# Patient Record
Sex: Male | Born: 1975 | Race: Black or African American | Hispanic: No | Marital: Married | Smoking: Former smoker
Health system: Southern US, Community
[De-identification: ages and names within clinical notes are randomized; demographics above are authoritative.]

---

## 2004-09-28 ENCOUNTER — Emergency Department: Payer: Self-pay | Admitting: General Practice

## 2004-10-19 ENCOUNTER — Inpatient Hospital Stay: Payer: Self-pay | Admitting: Internal Medicine

## 2004-10-19 ENCOUNTER — Other Ambulatory Visit: Payer: Self-pay

## 2004-10-26 ENCOUNTER — Ambulatory Visit: Payer: Self-pay

## 2005-01-26 ENCOUNTER — Other Ambulatory Visit: Payer: Self-pay

## 2005-01-26 ENCOUNTER — Ambulatory Visit: Payer: Self-pay | Admitting: Family Medicine

## 2006-07-26 ENCOUNTER — Emergency Department: Payer: Self-pay | Admitting: Emergency Medicine

## 2007-02-15 ENCOUNTER — Emergency Department (HOSPITAL_COMMUNITY): Admission: EM | Admit: 2007-02-15 | Discharge: 2007-02-15 | Payer: Self-pay | Admitting: Emergency Medicine

## 2008-05-21 ENCOUNTER — Other Ambulatory Visit: Payer: Self-pay

## 2008-05-21 ENCOUNTER — Emergency Department: Payer: Self-pay | Admitting: Emergency Medicine

## 2008-05-28 IMAGING — CR DG CHEST 2V
2 series · 2 of 2 positions shown · non-contrast
Comparison: None.

CLINICAL DATA: Chest pain

[w chest pa]
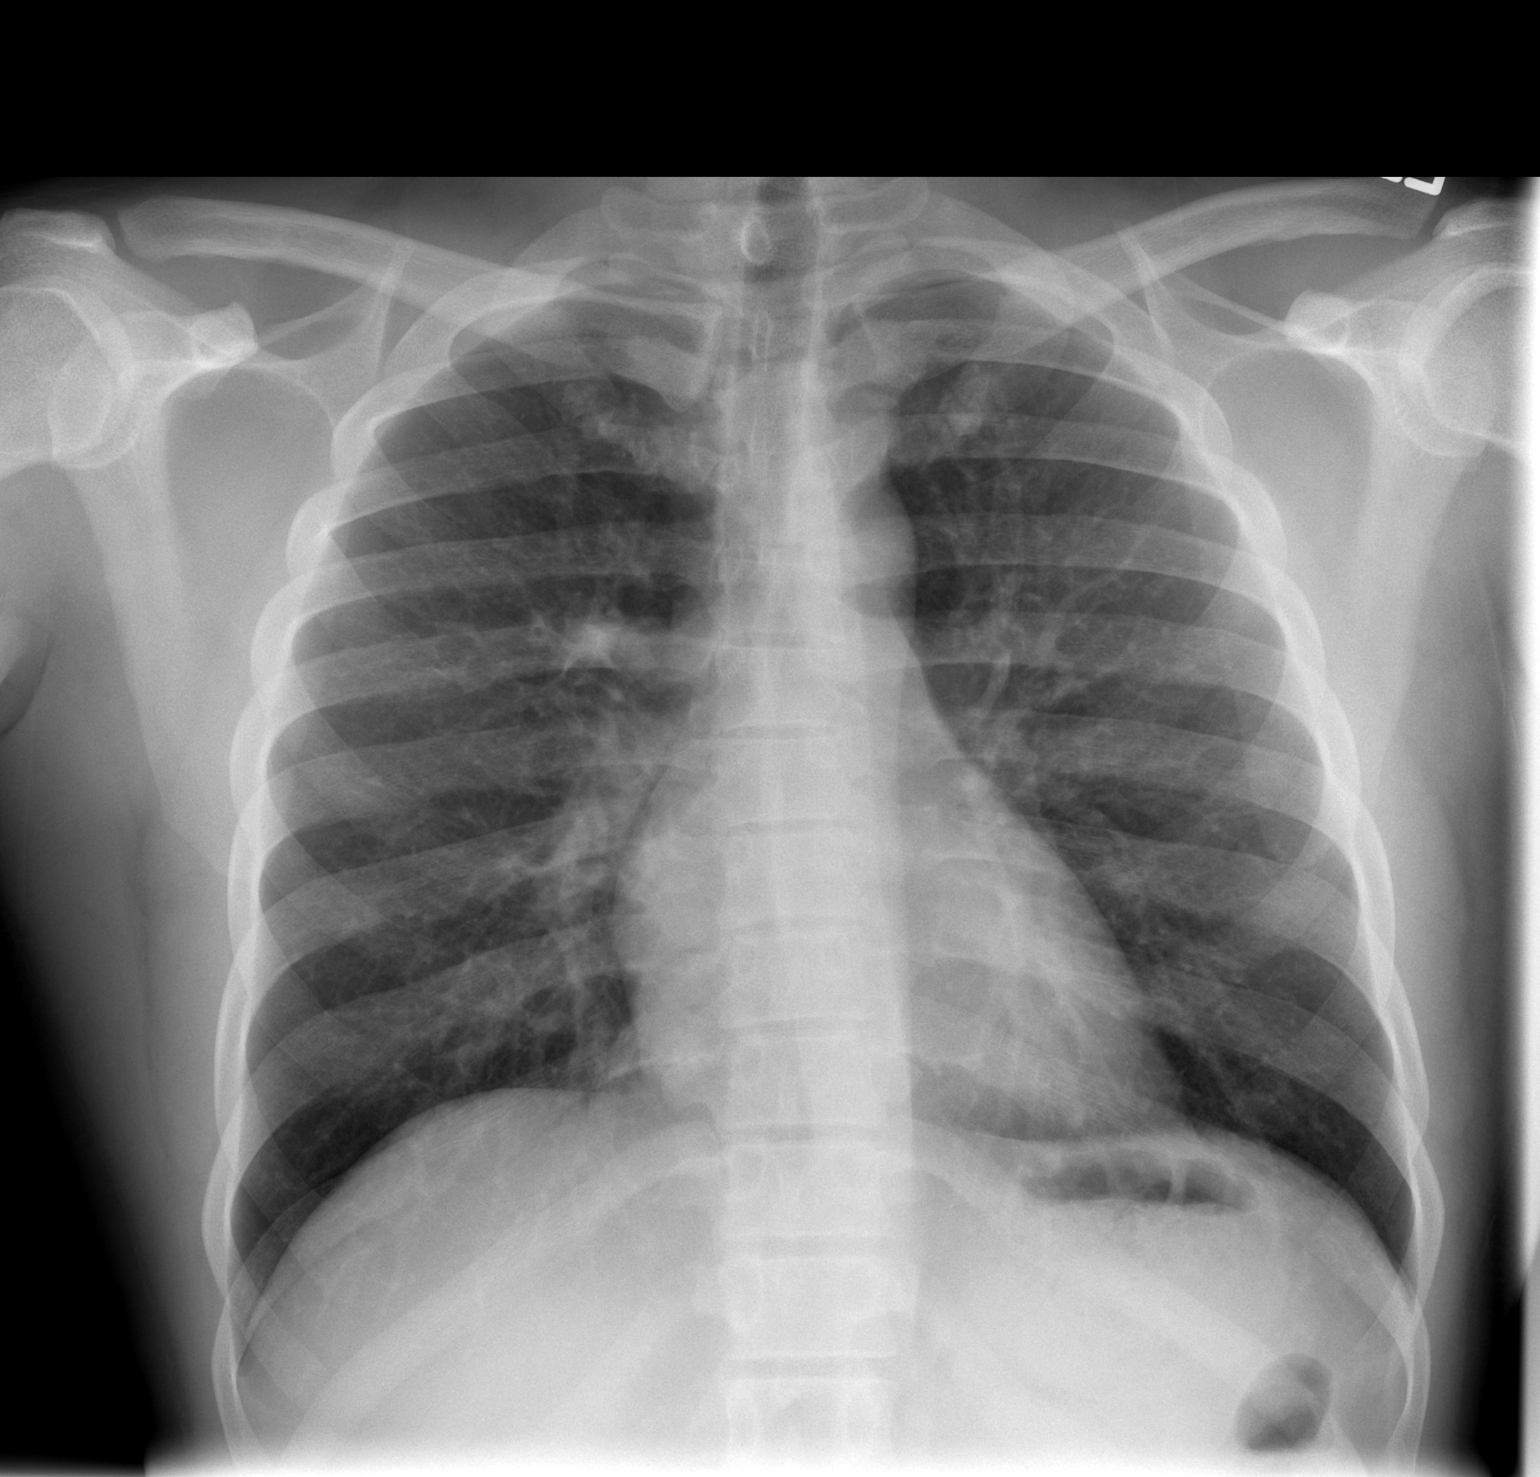

[w chest lat]
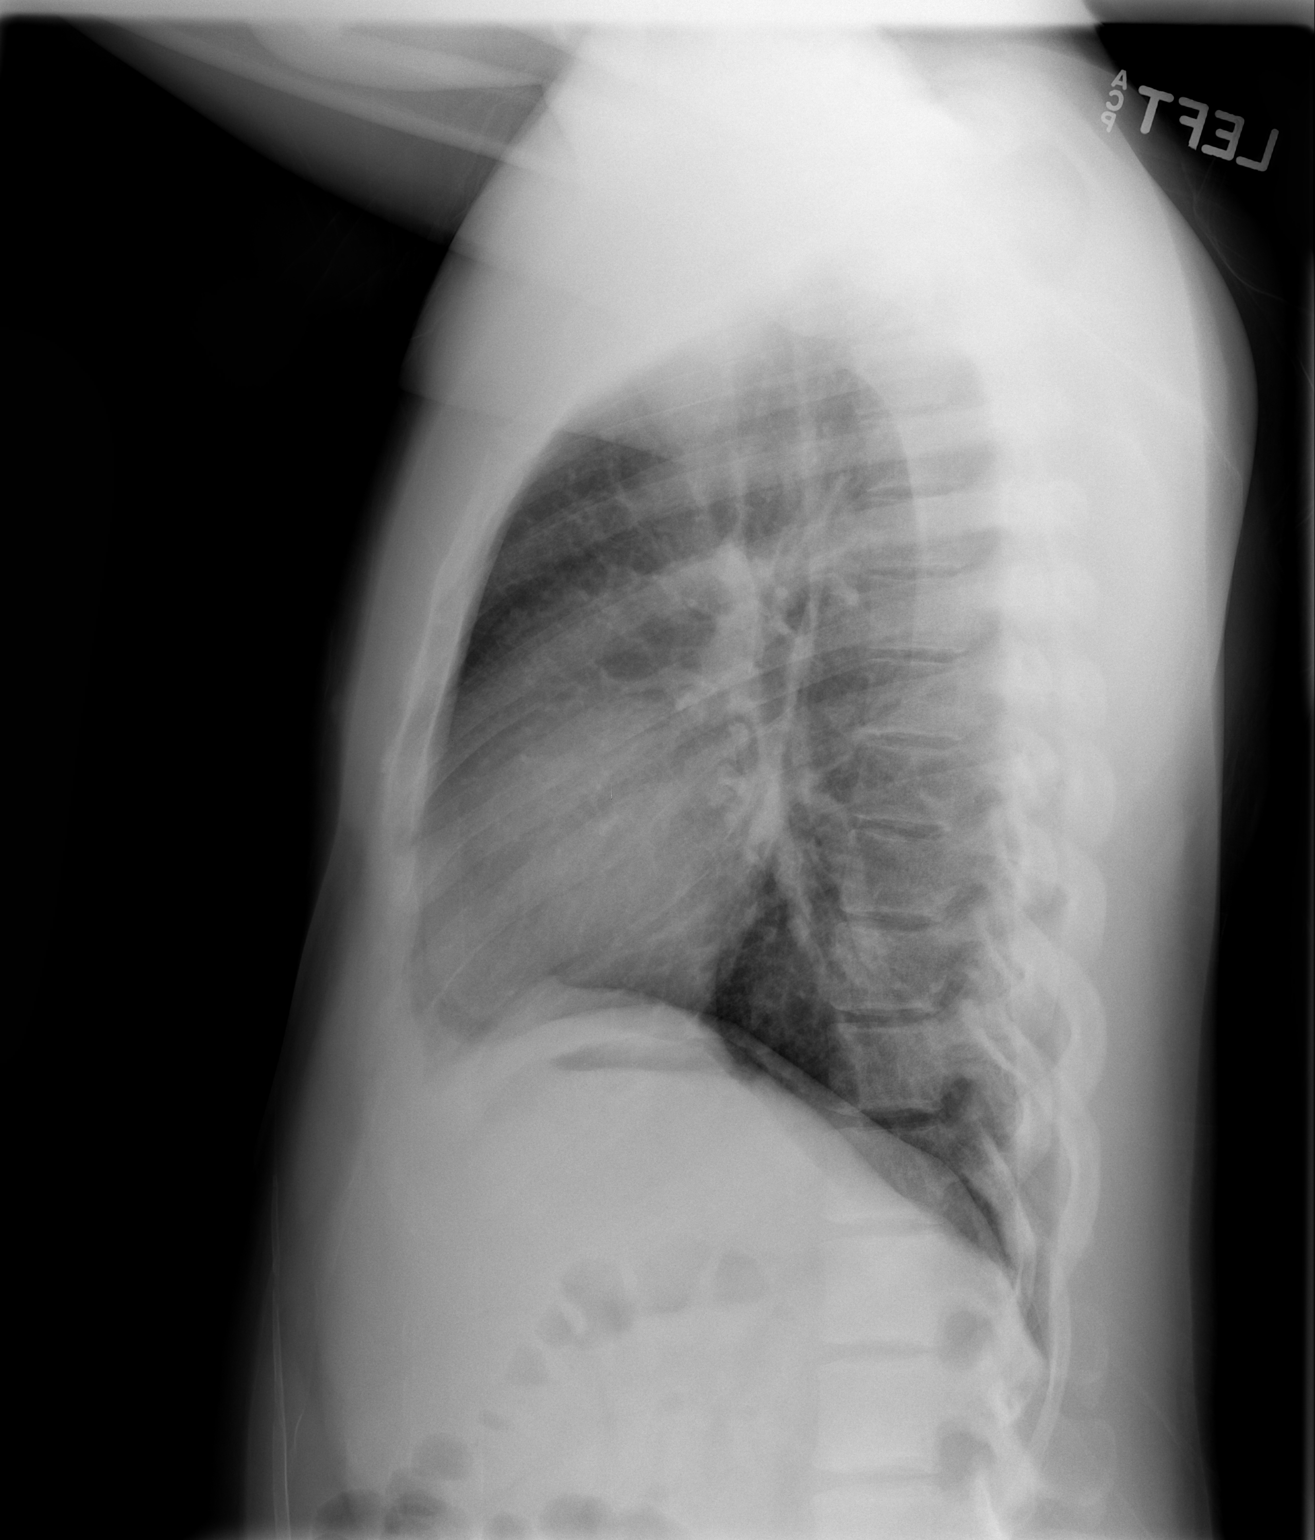

[2 of 2 positions shown; findings below may reference images not displayed]

CHEST - 2 VIEW:

The lungs are clear without focal infiltrate, edema or pleural effusion.
Interstitial markings are diffusely coarsened. Imaged bony structures of the
thorax are intact.
IMPRESSION: No acute cardiopulmonary process

## 2008-07-06 ENCOUNTER — Emergency Department: Payer: Self-pay | Admitting: Emergency Medicine

## 2009-08-15 ENCOUNTER — Inpatient Hospital Stay: Payer: Self-pay | Admitting: Internal Medicine

## 2009-09-01 IMAGING — CR DG CHEST 1V PORT
1 series · 1 of 1 positions shown · non-contrast
Comparison: none

REASON FOR EXAM: Hyperglycemia
COMMENTS:

[view not recorded]
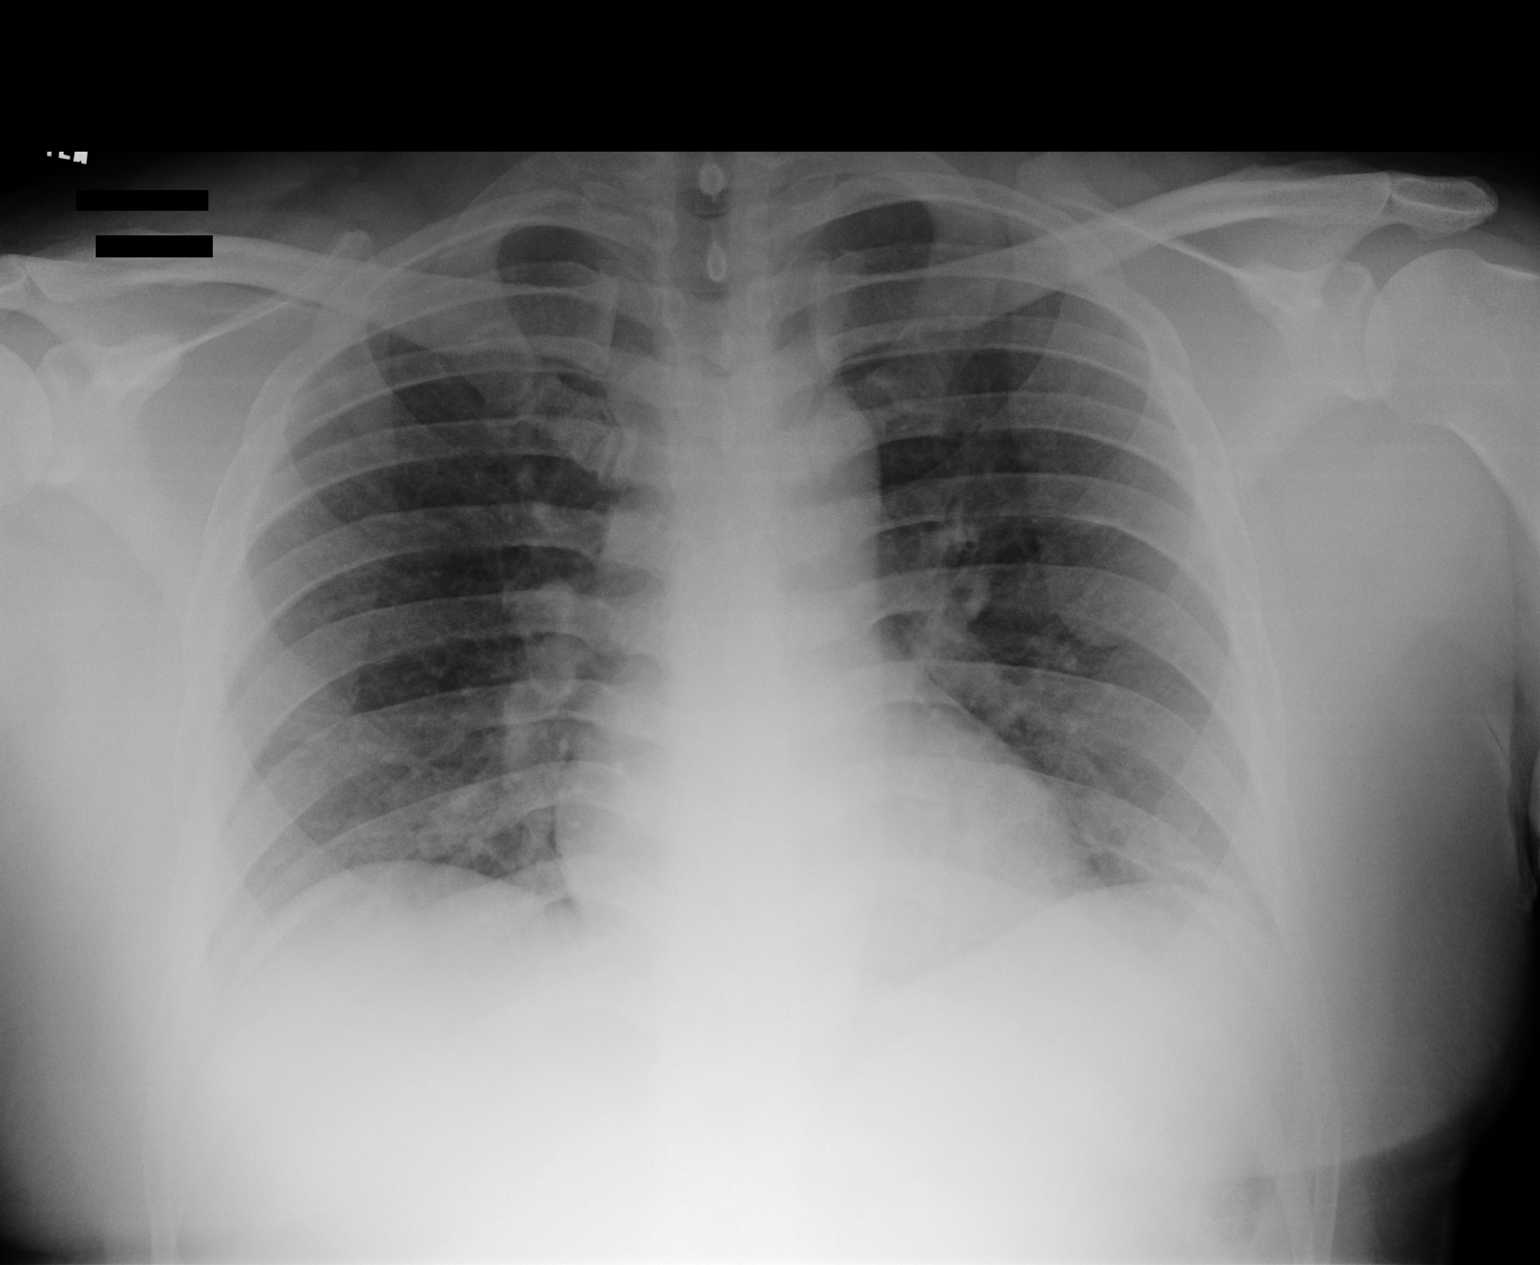

[1 of 1 positions shown; findings below may reference images not displayed]

PROCEDURE:     DXR - DXR PORTABLE CHEST SINGLE VIEW  - May 21, 2008  [DATE]

RESULT:     There is no previous exam available for comparison.

There is bibasilar atelectasis with shallow inspiration. The lungs are clear
otherwise. The heart and pulmonary vessels are normal. The bony structures
are unremarkable.
IMPRESSION: Shallow inspiration with bibasilar atelectasis.

## 2009-10-18 IMAGING — CR DG CHEST 2V
1 series · 2 of 2 positions shown · non-contrast
Comparison: none

REASON FOR EXAM: high sugar weak
COMMENTS:

PROCEDURE:     DXR - DXR CHEST PA (OR AP) AND LATERAL  - July 07, 2008  [DATE]
RESULT:     The lung fields are clear. No pneumonia, pneumothorax or pleural
effusion is seen. Heart size is normal. The mediastinal and osseous
structures show no significant abnormalities.

[Series 1: view not recorded · 0.17mm/px · 2 of 2 slices shown]
[im 1/2]
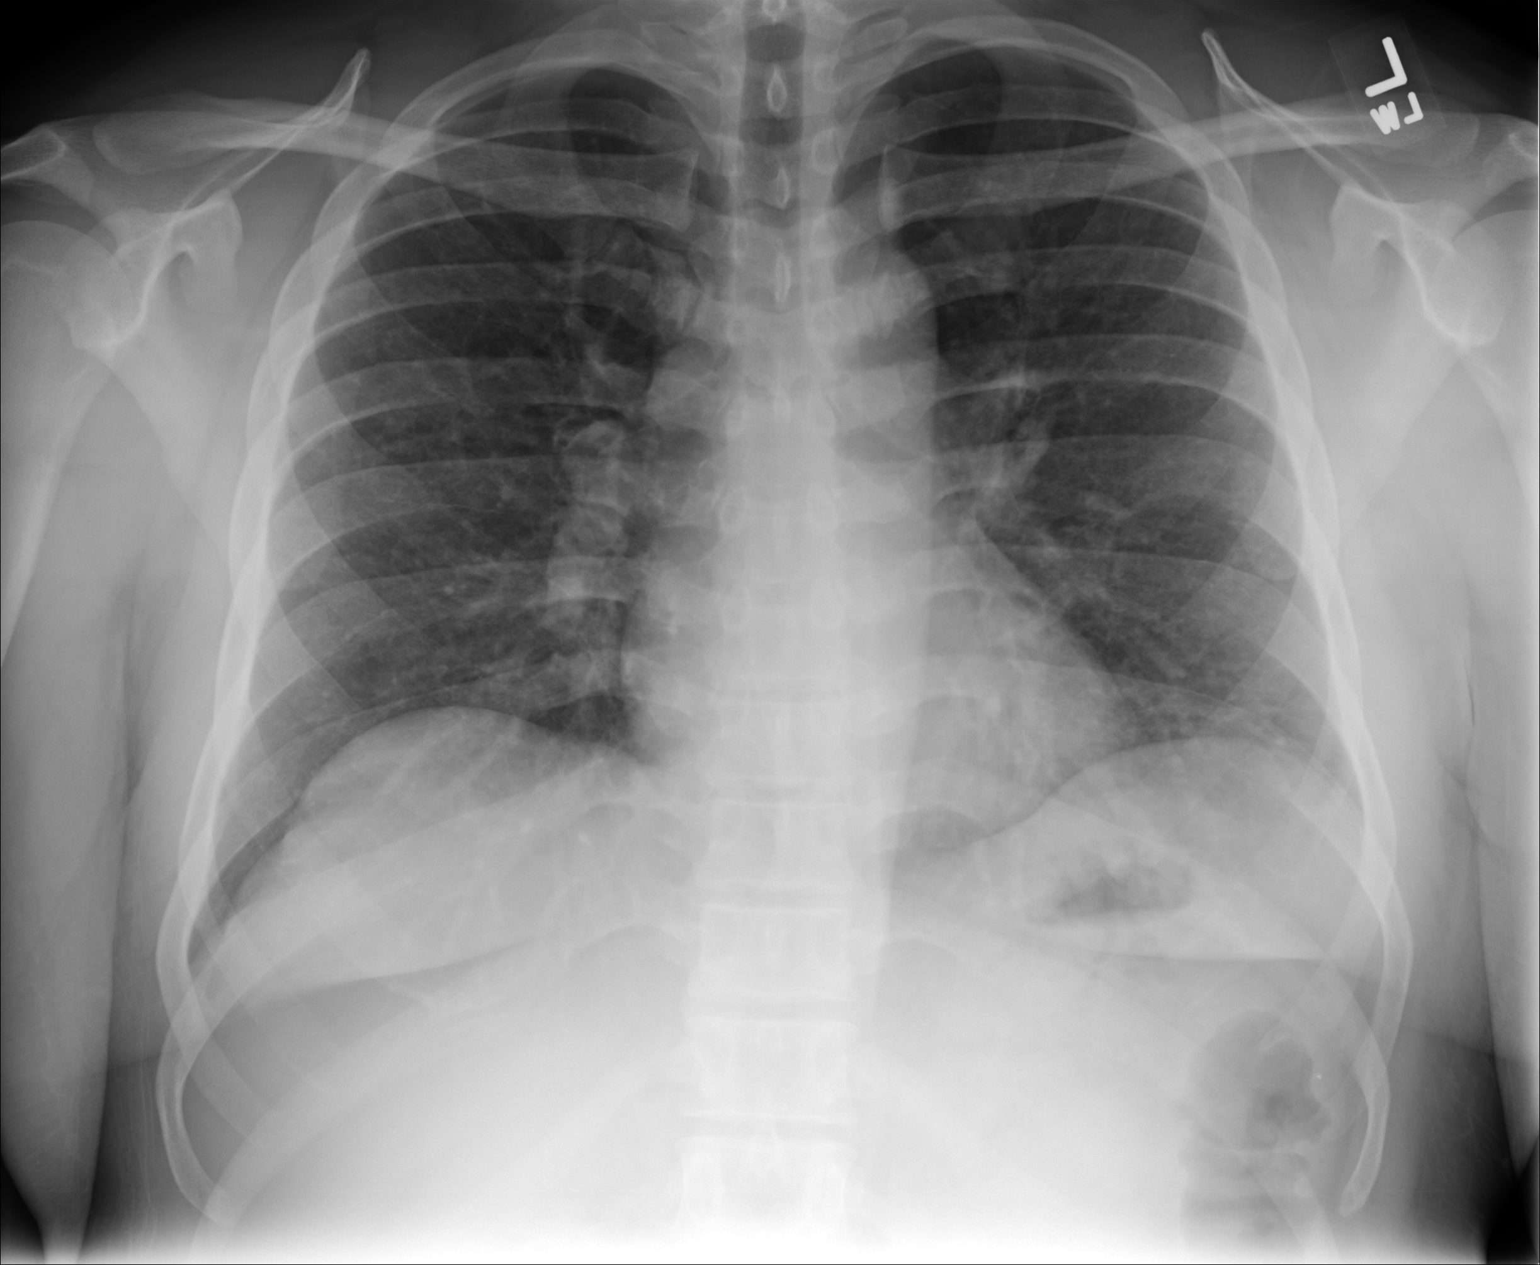
[im 2/2]
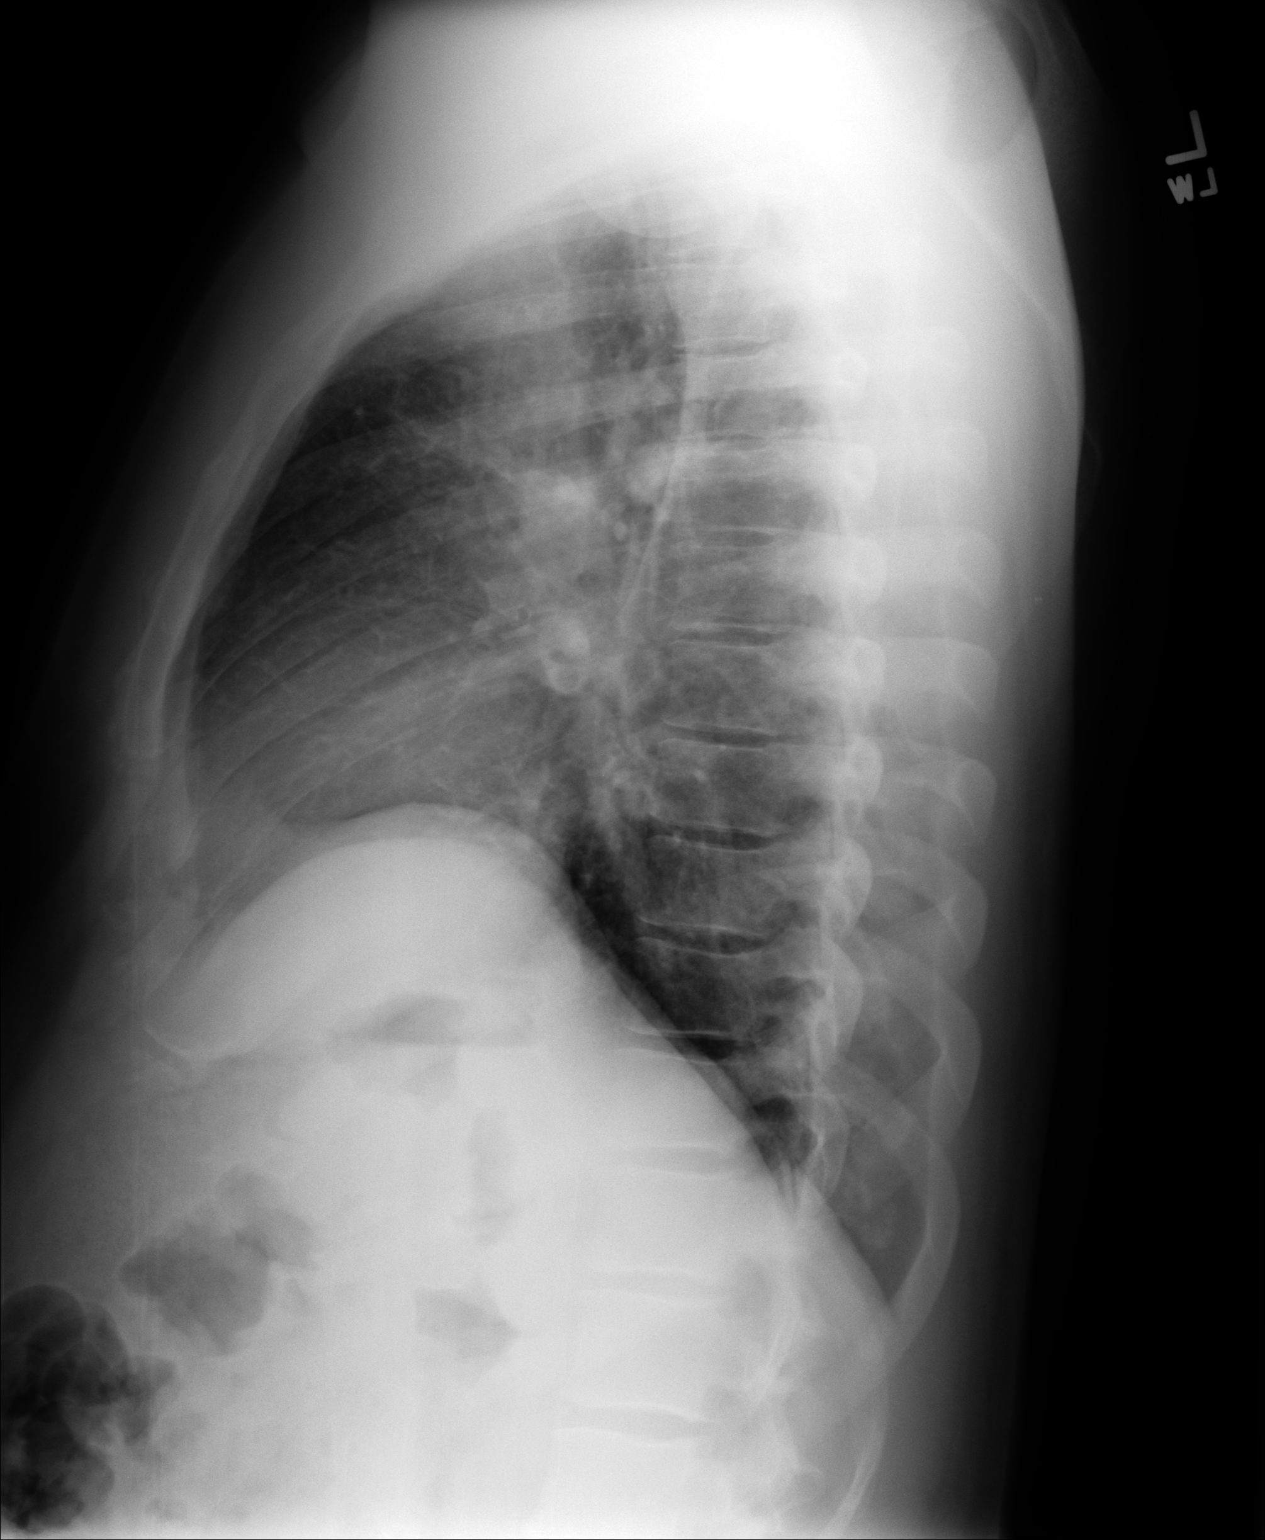

[2 of 2 positions shown; findings below may reference images not displayed]

IMPRESSION: 1.     No acute changes are identified.

## 2010-05-24 ENCOUNTER — Emergency Department: Payer: Self-pay | Admitting: Emergency Medicine

## 2014-10-22 ENCOUNTER — Emergency Department: Payer: Self-pay | Admitting: Emergency Medicine

## 2021-10-27 ENCOUNTER — Ambulatory Visit: Payer: Self-pay | Admitting: Family Medicine

## 2021-10-27 ENCOUNTER — Other Ambulatory Visit: Payer: Self-pay

## 2021-10-27 ENCOUNTER — Encounter: Payer: Self-pay | Admitting: Family Medicine

## 2021-10-27 DIAGNOSIS — N481 Balanitis: Secondary | ICD-10-CM

## 2021-10-27 DIAGNOSIS — Z113 Encounter for screening for infections with a predominantly sexual mode of transmission: Secondary | ICD-10-CM

## 2021-10-27 MED ORDER — CLOTRIMAZOLE-BETAMETHASONE 1-0.05 % EX CREA: 1.0000 "application " | TOPICAL_CREAM | Freq: Every day | CUTANEOUS | 0 refills | Status: AC

## 2021-10-27 NOTE — Progress Notes (Signed)
[facility] Department ?STI clinic/screening visit ? ?Subjective:  ?Jimmy Murphy is a 46 y.o. male being seen today for an STI screening visit. The patient reports they do have symptoms.   ? ?Patient has the following medical conditions:  There are no problems to display for this patient. ? ? ? ?Chief Complaint  ?Patient presents with  ? SEXUALLY TRANSMITTED DISEASE  ?  screening  ? ? ?HPI ? ?Patient reports concerns about bumps on penis  ? ?Does the patient or their partner desires a pregnancy in the next year? No ? ?Screening for MPX risk: ?Does the patient have an unexplained rash? No ?Is the patient MSM? No ?Does the patient endorse multiple sex partners or anonymous sex partners? No ?Did the patient have close or sexual contact with a person diagnosed with MPX? No ?Has the patient traveled outside the [place] where MPX is endemic? No ?Is there a high clinical suspicion for MPX-- evidenced by one of the following No ? -Unlikely to be chickenpox ? -Lymphadenopathy ? -Rash that present in same phase of evolution on any given body part ? ? ?See flowsheet for further details and programmatic requirements.  ? ? ?The following portions of the patient's history were reviewed and updated as appropriate: allergies, current medications, past medical history, past social history, past surgical history and problem list. ? ?Objective:  ?There were no vitals filed for this visit. ? ?Physical Exam ?Constitutional:   ?   Appearance: Normal appearance.  ?HENT:  ?   Head: Normocephalic.  ?   Mouth/Throat:  ?   Mouth: Mucous membranes are moist.  ?   Pharynx: Oropharynx is clear. No oropharyngeal exudate.  ?Pulmonary:  ?   Effort: Pulmonary effort is normal.  ?Genitourinary: ?   Penis: Normal and uncircumcised.   ?   Testes: Normal.  ?   Comments: No lice, nits, or pest, no lesions or odor discharge.  Denies pain or tenderness with paplation of testicles.  No lesions, ulcers or masses present.  Reddish macules on head of  penis. ?Musculoskeletal:  ?   Cervical back: Normal range of motion.  ?Lymphadenopathy:  ?   Cervical: No cervical adenopathy.  ?Skin: ?   General: Skin is warm and dry.  ?   Findings: No bruising, erythema, lesion or rash.  ?Neurological:  ?   General: No focal deficit present.  ?   Mental Status: He is alert and oriented to person, place, and time.  ?Psychiatric:     ?   Mood and Affect: Mood normal.     ?   Behavior: Behavior normal.  ? ? ? ? ?Assessment and Plan:  ?Jimmy Murphy is a 46 y.o. male presenting to the [facility] Department for STI screening ? ?1. Screening examination for venereal disease ?Pt declined screening after interview .  He is not concerned or things that he has a STI, just concerned about "bumps on penis"  ? ?2. Balanitis ?Discussed balanitis and hygiene.  Recommended to use saline to clean penis and instructed to use clotrimazole BID.   No sex for 14 day.  ? ?- clotrimazole-betamethasone (LOTRISONE) cream; Apply 1 application. topically daily.  Dispense: 30 g; Refill: 0 ? ? ? ?No follow-ups on file. ? ?No future appointments. ? ?Wendi Snipes, FNP ? ?

## 2021-10-27 NOTE — Progress Notes (Signed)
Patient treated per provider orders, counseled to use Lotrisone twice daily after cleansing with normal saline. Patient counseled to get NS from pharmacy and use twice daily, then dry the area completely, then use the cream. Counseled no sex for 14 days.Burt Knack, RN  ?

## 2021-11-04 ENCOUNTER — Other Ambulatory Visit: Payer: Self-pay

## 2021-11-04 ENCOUNTER — Emergency Department
Admission: EM | Admit: 2021-11-04 | Discharge: 2021-11-04 | Disposition: A | Discharge status: Home / Self Care | Payer: Self-pay | Attending: Emergency Medicine | Admitting: Emergency Medicine

## 2021-11-04 DIAGNOSIS — E1165 Type 2 diabetes mellitus with hyperglycemia: Secondary | ICD-10-CM | POA: Insufficient documentation

## 2021-11-04 DIAGNOSIS — R739 Hyperglycemia, unspecified: Secondary | ICD-10-CM

## 2021-11-04 LAB — CBC
HCT: 43.7 % (ref 39.0–52.0)
Hemoglobin: 15.2 g/dL (ref 13.0–17.0)
MCH: 28.9 pg (ref 26.0–34.0)
MCHC: 34.8 g/dL (ref 30.0–36.0)
MCV: 83.1 fL (ref 80.0–100.0)
Platelets: 211 10*3/uL (ref 150–400)
RBC: 5.26 MIL/uL (ref 4.22–5.81)
RDW: 12.4 % (ref 11.5–15.5)
WBC: 6.4 10*3/uL (ref 4.0–10.5)
nRBC: 0 % (ref 0.0–0.2)

## 2021-11-04 LAB — BASIC METABOLIC PANEL
Anion gap: 10 (ref 5–15)
BUN: 13 mg/dL (ref 6–20)
CO2: 21 mmol/L — ABNORMAL LOW (ref 22–32)
Calcium: 9.3 mg/dL (ref 8.9–10.3)
Chloride: 100 mmol/L (ref 98–111)
Creatinine, Ser: 1.11 mg/dL (ref 0.61–1.24)
GFR, Estimated: >60 mL/min (ref >60)
Glucose, Bld: 484 mg/dL — ABNORMAL HIGH (ref 70–99)
Potassium: 4 mmol/L (ref 3.5–5.1)
Sodium: 131 mmol/L — ABNORMAL LOW (ref 135–145)

## 2021-11-04 LAB — CBG MONITORING, ED: Glucose-Capillary: 369 mg/dL — ABNORMAL HIGH (ref 70–99)

## 2021-11-04 MED ORDER — METFORMIN HCL 500 MG PO TABS: 500.0000 mg | ORAL_TABLET | Freq: Two times a day (BID) | ORAL | 0 refills | Status: AC

## 2021-11-04 MED ORDER — SODIUM CHLORIDE 0.9 % IV BOLUS
1000.0000 mL | Freq: Once | INTRAVENOUS | Status: AC
  Administered 2021-11-04: 1000 mL via INTRAVENOUS

## 2021-11-04 MED ORDER — BLOOD GLUCOSE MONITOR KIT: PACK | 0 refills | Status: AC

## 2021-11-04 NOTE — Discharge Instructions (Addendum)
Your blood sugar was elevated today indicating that you need to be on medication for your diabetes.  I have prescribed you metformin which she should take twice a day.  You can check your blood sugar about once per day.  It is very important that you follow-up with primary care provider as you likely need to be started on additional medications for the diabetes.  Try to avoid sugar and high carbohydrate foods as this will elevate your blood sugar more. ?

## 2021-11-04 NOTE — ED Provider Notes (Signed)
? ?[facility] ?Provider Note ? ? ? Event Date/Time  ? First MD Initiated Contact with Patient 11/04/21 1716   ?  (approximate) ? ? ?History  ? ?Dizziness ? ? ?HPI ? ?Jimmy Murphy is a 46 y.o. male with a remote history of diabetes but not on meds currently for several years who presents with lightheadedness and blurred vision.  Patient saw health department several days ago was diagnosed with balanitis and treated with clotrimazole.  That has cleared up but over the last several days he has noticed lightheadedness upon standing as well as blurred vision.  Blurred vision intermittent worse with seeing far.  He denies nausea vomiting.  Is thirsty and drinking a lot of water.  Denies fevers chills chest pain palpitations.  Denies numbness tingling weakness or headache.  Tells me that he did take insulin a long time ago but his body was able to work without it has not been on meds for diabetes in some time. ? ?  ? ?History reviewed. No pertinent past medical history. ? ?There are no problems to display for this patient. ? ? ? ?Physical Exam  ?Triage Vital Signs: ?ED Triage Vitals  ?Enc Vitals Group  ?   BP 11/04/21 1714 (!) 130/96  ?   Pulse Rate 11/04/21 1714 89  ?   Resp 11/04/21 1714 18  ?   Temp 11/04/21 1714 98.3 ?F (36.8 ?C)  ?   Temp src --   ?   SpO2 11/04/21 1714 97 %  ?   Weight 11/04/21 1706 220 lb (99.8 kg)  ?   Height 11/04/21 1706 5' 8"  (1.727 m)  ?   Head Circumference --   ?   Peak Flow --   ?   Pain Score 11/04/21 1706 0  ?   Pain Loc --   ?   Pain Edu? --   ?   Excl. in [place]? --   ? ? ?Most recent vital signs: ?Vitals:  ? 11/04/21 1714  ?BP: (!) 130/96  ?Pulse: 89  ?Resp: 18  ?Temp: 98.3 ?F (36.8 ?C)  ?SpO2: 97%  ? ? ? ?General: Awake, no distress.  ?CV:  Good peripheral perfusion.  ?Resp:  Normal effort.  ?Abd:  No distention.  ?Neuro:             Awake, Alert, Oriented x 3  ?Other:  Aox3, nml speech  ?PERRL, EOMI, face symmetric, nml tongue movement  ?5/5 strength in the BL upper  and lower extremities  ?Sensation grossly intact in the BL upper and lower extremities  ?Finger-nose-finger intact BL ? ? ? ?ED Results / Procedures / Treatments  ?Labs ?(all labs ordered are listed, but only abnormal results are displayed) ?Labs Reviewed  ?BASIC METABOLIC PANEL - Abnormal; Notable for the following components:  ?    Result Value  ? Sodium 131 (*)   ? CO2 21 (*)   ? Glucose, Bld 484 (*)   ? All other components within normal limits  ?CBG MONITORING, ED - Abnormal; Notable for the following components:  ? Glucose-Capillary 369 (*)   ? All other components within normal limits  ?CBC  ?URINALYSIS, ROUTINE W REFLEX MICROSCOPIC  ? ? ? ?EKG ? ?EKG interpretation performed by myself: NSR, nml axis, nml intervals, biphasic T wave in laed V6no acute ischemic changes ? ? ? ?RADIOLOGY ? ? ? ?PROCEDURES: ? ?Critical Care performed: No ? ?Procedures ? ? ? ?MEDICATIONS ORDERED IN ED: ?Medications  ?sodium chloride  0.9 % bolus 1,000 mL (has no administration in time range)  ?sodium chloride 0.9 % bolus 1,000 mL (1,000 mLs Intravenous New Bag/Given 11/04/21 1820)  ? ? ? ?IMPRESSION / MDM / ASSESSMENT AND PLAN / ED COURSE  ?I reviewed the triage vital signs and the nursing notes. ?             ?               ? ?Differential diagnosis includes, but is not limited to, dehydration, hyperglycemia, infection ? ?The patient is a 46 year old male with a remote history of diabetes who presents with blurred vision and presyncope with standing.  Vital signs within normal limits.  He is notably hyperglycemic with a sugar of 484 I suspect is the cause of his orthostasis and blurred vision.  He has a remote history of diabetes and was on metformin and insulin at one time but is not been taking it in multiple years.  He otherwise has no infectious symptoms no ischemic symptoms including chest pain palpitations or shortness of breath.  Reviewed his EKG which is nonischemic.  Patient overall appears well.  Clinically does not  appear to be in DKA, bicarb just mildly low at 21 but he has no anion gap.  Creatinine 1.1 with no prior to compare to.  Discussed the findings with the patient that likely he is going to need to be on insulin or oral antihyperglycemics.  We will give him a liter of fluid.  This is likely what caused the balanitis as well.  We will start him on metformin advised that he needs to see primary care within the next week or so to get started on medication. ? ?Patient's sugars 380 after almost 1 L of fluid.  We will give him another bolus.  Have sent prescription for metformin as well as lancets and blood glucose test strips to pharmacy.  Stressed the importance of follow-up. ? ?Clinical Course as of 11/04/21 1931  ?Sat Nov 04, 2021  ?1915 Glucose-Capillary(!): 369 [KM]  ?  ?Clinical Course User Index ?[KM] Rada Hay, MD  ? ? ? ?FINAL CLINICAL IMPRESSION(S) / ED DIAGNOSES  ? ?Final diagnoses:  ?Hyperglycemia  ? ? ? ?Rx / DC Orders  ? ?ED Discharge Orders   ? ?      Ordered  ?  blood glucose meter kit and supplies KIT       ? 11/04/21 1930  ?  metFORMIN (GLUCOPHAGE) 500 MG tablet  2 times daily with meals       ? 11/04/21 1930  ? ?  ?  ? ?  ? ? ? ?Note:  This document was prepared using Dragon voice recognition software and may include unintentional dictation errors. ?  ?Rada Hay, MD ?11/04/21 1931 ? ?

## 2021-11-04 NOTE — ED Triage Notes (Signed)
Patient to ER via POV with complaints of dizziness and blurred vision.  ? ?States he was recently diagnosed with a yeast infection and has been using the ointment as prescribed but states he feels as though he needs an antibiotic because ever since Monday he has had blurred vision in both eyes. Also endorses some dizziness.  ?
